# Patient Record
Sex: Female | Born: 1965 | Race: Asian | Hispanic: No | Marital: Married | Smoking: Never smoker
Health system: Southern US, Community
[De-identification: ages and names within clinical notes are randomized; demographics above are authoritative.]

## PROBLEM LIST (undated history)

## (undated) DIAGNOSIS — M1990 Unspecified osteoarthritis, unspecified site: Secondary | ICD-10-CM

---

## 2014-05-13 ENCOUNTER — Encounter (HOSPITAL_COMMUNITY): Payer: Self-pay | Admitting: Emergency Medicine

## 2014-05-13 ENCOUNTER — Emergency Department (HOSPITAL_COMMUNITY): Payer: No Typology Code available for payment source

## 2014-05-13 ENCOUNTER — Emergency Department (HOSPITAL_COMMUNITY)
Admission: EM | Admit: 2014-05-13 | Discharge: 2014-05-13 | Disposition: A | Discharge status: Home / Self Care | Payer: No Typology Code available for payment source | Attending: Emergency Medicine | Admitting: Emergency Medicine

## 2014-05-13 DIAGNOSIS — W19XXXA Unspecified fall, initial encounter: Secondary | ICD-10-CM

## 2014-05-13 DIAGNOSIS — M1990 Unspecified osteoarthritis, unspecified site: Secondary | ICD-10-CM | POA: Diagnosis not present

## 2014-05-13 DIAGNOSIS — M545 Low back pain, unspecified: Secondary | ICD-10-CM

## 2014-05-13 DIAGNOSIS — S79911A Unspecified injury of right hip, initial encounter: Secondary | ICD-10-CM | POA: Diagnosis not present

## 2014-05-13 DIAGNOSIS — Y9289 Other specified places as the place of occurrence of the external cause: Secondary | ICD-10-CM | POA: Diagnosis not present

## 2014-05-13 DIAGNOSIS — W1839XA Other fall on same level, initial encounter: Secondary | ICD-10-CM | POA: Diagnosis not present

## 2014-05-13 DIAGNOSIS — S3992XA Unspecified injury of lower back, initial encounter: Secondary | ICD-10-CM | POA: Diagnosis not present

## 2014-05-13 DIAGNOSIS — Y990 Civilian activity done for income or pay: Secondary | ICD-10-CM | POA: Insufficient documentation

## 2014-05-13 DIAGNOSIS — Y9301 Activity, walking, marching and hiking: Secondary | ICD-10-CM | POA: Insufficient documentation

## 2014-05-13 DIAGNOSIS — M25551 Pain in right hip: Secondary | ICD-10-CM

## 2014-05-13 DIAGNOSIS — Z79899 Other long term (current) drug therapy: Secondary | ICD-10-CM | POA: Diagnosis not present

## 2014-05-13 HISTORY — DX: Unspecified osteoarthritis, unspecified site: M19.90

## 2014-05-13 MED ORDER — HYDROCODONE-ACETAMINOPHEN 5-325 MG PO TABS: 1.0000 | ORAL_TABLET | ORAL | Status: DC | PRN

## 2014-05-13 MED ORDER — HYDROCODONE-ACETAMINOPHEN 5-325 MG PO TABS
1.0000 | ORAL_TABLET | Freq: Once | ORAL | Status: AC
  Administered 2014-05-13: 1 via ORAL
  Filled 2014-05-13: qty 1
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
## 2014-05-13 NOTE — Discharge Instructions (Signed)
Return to the emergency room with worsening of symptoms, new symptoms or with symptoms that are concerning , especially fevers, loss of control of bladder or bowels, numbness or tingling around genital region or anus, weakness. RICE: Rest, Ice (three cycles of 20 mins on, 20mins off at least twice a day), compression/brace, elevation. Heating pad works well for back pain. Ibuprofen 400mg  (2 tablets 200mg ) every 5-6 hours for 3-5 days. Norco for severe pain. Do not operate machinery, drive or drink alcohol while taking narcotics or muscle relaxers. Follow up with PCP if symptoms worsen or are persistent. Read below information and follow recommendations. Hip Pain Your hip is the joint between your upper legs and your lower pelvis. The bones, cartilage, tendons, and muscles of your hip joint perform a lot of work each day supporting your body weight and allowing you to move around. Hip pain can range from a minor ache to severe pain in one or both of your hips. Pain may be felt on the inside of the hip joint near the groin, or the outside near the buttocks and upper thigh. You may have swelling or stiffness as well.  HOME CARE INSTRUCTIONS   Take medicines only as directed by your health care provider.  Apply ice to the injured area:  Put ice in a plastic bag.  Place a towel between your skin and the bag.  Leave the ice on for 15-20 minutes at a time, 3-4 times a day.  Keep your leg raised (elevated) when possible to lessen swelling.  Avoid activities that cause pain.  Follow specific exercises as directed by your health care provider.  Sleep with a pillow between your legs on your most comfortable side.  Record how often you have hip pain, the location of the pain, and what it feels like. SEEK MEDICAL CARE IF:   You are unable to put weight on your leg.  Your hip is red or swollen or very tender to touch.  Your pain or swelling continues or worsens after 1 week.  You have  increasing difficulty walking.  You have a fever. SEEK IMMEDIATE MEDICAL CARE IF:   You have fallen.  You have a sudden increase in pain and swelling in your hip. MAKE SURE YOU:   Understand these instructions.  Will watch your condition.  Will get help right away if you are not doing well or get worse. Document Released: 08/15/2009 Document Revised: 07/12/2013 Document Reviewed: 10/22/2012 [facility] Patient Information 2015 [place], [place]. This information is not intended to replace advice given to you by your health care provider. Make sure you discuss any questions you have with your health care provider.
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
## 2014-05-19 ENCOUNTER — Emergency Department (HOSPITAL_COMMUNITY): Payer: No Typology Code available for payment source

## 2014-05-19 ENCOUNTER — Emergency Department (HOSPITAL_COMMUNITY)
Admission: EM | Admit: 2014-05-19 | Discharge: 2014-05-19 | Disposition: A | Discharge status: Home / Self Care | Payer: No Typology Code available for payment source | Attending: Emergency Medicine | Admitting: Emergency Medicine

## 2014-05-19 ENCOUNTER — Encounter (HOSPITAL_COMMUNITY): Payer: Self-pay | Admitting: *Deleted

## 2014-05-19 DIAGNOSIS — S3991XA Unspecified injury of abdomen, initial encounter: Secondary | ICD-10-CM | POA: Insufficient documentation

## 2014-05-19 DIAGNOSIS — Y9389 Activity, other specified: Secondary | ICD-10-CM | POA: Insufficient documentation

## 2014-05-19 DIAGNOSIS — S3992XA Unspecified injury of lower back, initial encounter: Secondary | ICD-10-CM | POA: Diagnosis not present

## 2014-05-19 DIAGNOSIS — Z79899 Other long term (current) drug therapy: Secondary | ICD-10-CM | POA: Diagnosis not present

## 2014-05-19 DIAGNOSIS — Y990 Civilian activity done for income or pay: Secondary | ICD-10-CM | POA: Diagnosis not present

## 2014-05-19 DIAGNOSIS — M1990 Unspecified osteoarthritis, unspecified site: Secondary | ICD-10-CM | POA: Insufficient documentation

## 2014-05-19 DIAGNOSIS — M545 Low back pain, unspecified: Secondary | ICD-10-CM

## 2014-05-19 DIAGNOSIS — Y92410 Unspecified street and highway as the place of occurrence of the external cause: Secondary | ICD-10-CM | POA: Insufficient documentation

## 2014-05-19 DIAGNOSIS — S29001A Unspecified injury of muscle and tendon of front wall of thorax, initial encounter: Secondary | ICD-10-CM | POA: Diagnosis not present

## 2014-05-19 DIAGNOSIS — R1031 Right lower quadrant pain: Secondary | ICD-10-CM

## 2014-05-19 LAB — COMPREHENSIVE METABOLIC PANEL
ALK PHOS: 86 U/L (ref 39–117)
ALT: 12 U/L (ref 0–35)
ANION GAP: 8 (ref 5–15)
AST: 16 U/L (ref 0–37)
Albumin: 3.7 g/dL (ref 3.5–5.2)
BUN: 29 mg/dL — AB (ref 6–23)
CO2: 24 mmol/L (ref 19–32)
Calcium: 8.9 mg/dL (ref 8.4–10.5)
Chloride: 107 mmol/L (ref 96–112)
Creatinine, Ser: 0.73 mg/dL (ref 0.50–1.10)
Glucose, Bld: 87 mg/dL (ref 70–99)
Potassium: 3.9 mmol/L (ref 3.5–5.1)
Sodium: 139 mmol/L (ref 135–145)
TOTAL PROTEIN: 7.5 g/dL (ref 6.0–8.3)
Total Bilirubin: 0.5 mg/dL (ref 0.3–1.2)

## 2014-05-19 LAB — POC URINE PREG, ED: Preg Test, Ur: NEGATIVE

## 2014-05-19 LAB — CBC WITH DIFFERENTIAL/PLATELET
Basophils Absolute: 0 10*3/uL (ref 0.0–0.1)
Basophils Relative: 0 % (ref 0–1)
EOS ABS: 0.2 10*3/uL (ref 0.0–0.7)
EOS PCT: 2 % (ref 0–5)
HCT: 31.1 % — ABNORMAL LOW (ref 36.0–46.0)
Hemoglobin: 9.7 g/dL — ABNORMAL LOW (ref 12.0–15.0)
Lymphocytes Relative: 26 % (ref 12–46)
Lymphs Abs: 2 10*3/uL (ref 0.7–4.0)
MCH: 23 pg — ABNORMAL LOW (ref 26.0–34.0)
MCHC: 31.2 g/dL (ref 30.0–36.0)
MCV: 73.7 fL — AB (ref 78.0–100.0)
Monocytes Absolute: 0.5 10*3/uL (ref 0.1–1.0)
Monocytes Relative: 7 % (ref 3–12)
NEUTROS PCT: 65 % (ref 43–77)
Neutro Abs: 4.8 10*3/uL (ref 1.7–7.7)
Platelets: 337 10*3/uL (ref 150–400)
RBC: 4.22 MIL/uL (ref 3.87–5.11)
RDW: 14.7 % (ref 11.5–15.5)
WBC: 7.5 10*3/uL (ref 4.0–10.5)

## 2014-05-19 LAB — TROPONIN I: Troponin I: <0.03 ng/mL (ref <0.031)

## 2014-05-19 MED ORDER — HYDROCODONE-ACETAMINOPHEN 5-325 MG PO TABS
1.0000 | ORAL_TABLET | Freq: Once | ORAL | Status: AC
  Administered 2014-05-19: 1 via ORAL
  Filled 2014-05-19: qty 1

## 2014-05-19 MED ORDER — ONDANSETRON HCL 4 MG/2ML IJ SOLN
4.0000 mg | Freq: Once | INTRAMUSCULAR | Status: AC
  Administered 2014-05-19: 4 mg via INTRAVENOUS
  Filled 2014-05-19: qty 2

## 2014-05-19 MED ORDER — MORPHINE SULFATE 4 MG/ML IJ SOLN
4.0000 mg | Freq: Once | INTRAMUSCULAR | Status: AC
  Administered 2014-05-19: 4 mg via INTRAVENOUS
  Filled 2014-05-19: qty 1

## 2014-05-19 MED ORDER — IOHEXOL 300 MG/ML  SOLN
100.0000 mL | Freq: Once | INTRAMUSCULAR | Status: AC | PRN
  Administered 2014-05-19: 80 mL via INTRAVENOUS

## 2014-05-19 MED ORDER — SODIUM CHLORIDE 0.9 % IV BOLUS (SEPSIS)
1000.0000 mL | Freq: Once | INTRAVENOUS | Status: AC
  Administered 2014-05-19: 1000 mL via INTRAVENOUS

## 2014-05-19 MED ORDER — HYDROCODONE-ACETAMINOPHEN 5-325 MG PO TABS: 1.0000 | ORAL_TABLET | ORAL | Status: AC | PRN

## 2014-05-19 NOTE — Discharge Instructions (Signed)
You are anemic on your labs today (low hemoglobin), this needs to be rechecked by your family doctor.  Your liver was enlarged today, this needs to be rechecked by your family doctor.   Back Pain, Adult Low back pain is very common. About 1 in 5 people have back pain.The cause of low back pain is rarely dangerous. The pain often gets better over time.About half of people with a sudden onset of back pain feel better in just 2 weeks. About 8 in 10 people feel better by 6 weeks.  CAUSES Some common causes of back pain include:  Strain of the muscles or ligaments supporting the spine.  Wear and tear (degeneration) of the spinal discs.  Arthritis.  Direct injury to the back. DIAGNOSIS Most of the time, the direct cause of low back pain is not known.However, back pain can be treated effectively even when the exact cause of the pain is unknown.Answering your caregiver's questions about your overall health and symptoms is one of the most accurate ways to make sure the cause of your pain is not dangerous. If your caregiver needs more information, he or she may order lab work or imaging tests (X-rays or MRIs).However, even if imaging tests show changes in your back, this usually does not require surgery. HOME CARE INSTRUCTIONS For many people, back pain returns.Since low back pain is rarely dangerous, it is often a condition that people can learn to [facility] their own.   Remain active. It is stressful on the back to sit or stand in one place. Do not sit, drive, or stand in one place for more than 30 minutes at a time. Take short walks on level surfaces as soon as pain allows.Try to increase the length of time you walk each day.  Do not stay in bed.Resting more than 1 or 2 days can delay your recovery.  Do not avoid exercise or work.Your body is made to move.It is not dangerous to be active, even though your back may hurt.Your back will likely heal faster if you return to being active before  your pain is gone.  Pay attention to your body when you bend and lift. Many people have less discomfortwhen lifting if they bend their knees, keep the load close to their bodies,and avoid twisting. Often, the most comfortable positions are those that put less stress on your recovering back.  Find a comfortable position to sleep. Use a firm mattress and lie on your side with your knees slightly bent. If you lie on your back, put a pillow under your knees.  Only take over-the-counter or prescription medicines as directed by your caregiver. Over-the-counter medicines to reduce pain and inflammation are often the most helpful.Your caregiver may prescribe muscle relaxant drugs.These medicines help dull your pain so you can more quickly return to your normal activities and healthy exercise.  Put ice on the injured area.  Put ice in a plastic bag.  Place a towel between your skin and the bag.  Leave the ice on for 15-20 minutes, 03-04 times a day for the first 2 to 3 days. After that, ice and heat may be alternated to reduce pain and spasms.  Ask your caregiver about trying back exercises and gentle massage. This may be of some benefit.  Avoid feeling anxious or stressed.Stress increases muscle tension and can worsen back pain.It is important to recognize when you are anxious or stressed and learn ways to manage it.Exercise is a great option. SEEK MEDICAL CARE IF:  You have pain that is not relieved with rest or medicine.  You have pain that does not improve in 1 week.  You have new symptoms.  You are generally not feeling well. SEEK IMMEDIATE MEDICAL CARE IF:   You have pain that radiates from your back into your legs.  You develop new bowel or bladder control problems.  You have unusual weakness or numbness in your arms or legs.  You develop nausea or vomiting.  You develop abdominal pain.  You feel faint. Document Released: 02/25/2005 Document Revised: 08/27/2011 Document  Reviewed: 06/29/2013 [facility] Patient Information 2015 [place], [place]. This information is not intended to replace advice given to you by your health care provider. Make sure you discuss any questions you have with your health care provider. Hepatomegaly Hepatomegaly means the liver is larger than normal (enlarged). Some health problems can cause the liver to get bigger. Some people have an enlarged liver but do not know it.  CAUSES Possible causes of hepatomegaly include:  Liver disease, such as:  Cirrhosis. This is long-term (chronic) liver damage often caused by drinking too much alcohol. Cirrhosis may also be caused by other liver problems.  Hepatitis. This is an infection of the liver.  Fatty liver disease.  Disorders that cause things to accumulate in the liver (Wilson's disease, amyloidosis, hemochromatosis).  Cancer. The disease may start in the liver, or cancer may start somewhere else in the body and spread to the liver.  Heart or blood vessel disease. These can cause hepatomegaly if blood backs up into the liver. SYMPTOMS  Some people have no symptoms. If symptoms are present, they may include:  Abdominal pain on the right side.  Fatigue.  Loss of appetite.  Nausea.  Vomiting.  Yellowing of the skin and whites of the eyes (jaundice). DIAGNOSIS  To decide if your liver is enlarged, a caregiver will ask about your history and perform a physical exam. The caregiver may press on the right side of your abdomen to feel your liver. This is a way to check if the edge of your liver sticks out below your rib cage. Your caregiver may also order some tests that include:  Blood tests. These tests check whether your liver is working like it should be. They also check for infection.  Imaging tests. These are tests that take pictures of your liver. They may include:  A computed tomography (CT) scan. This is an X-ray guided by a computer.  Magnetic resonance imaging (MRI). This  test creates pictures by using magnets and a computer.  An ultrasound. Images are made using sound waves.  Liver biopsy. A small sample of liver tissue is taken out and examined under a microscope. TREATMENT  Treatment of hepatomegaly depends on what is causing it. HOME CARE INSTRUCTIONS What you need to do at home depends on the cause of your hepatomegaly. In general:  Take all medicine as directed by your caregiver. Follow the directions carefully. Do not start taking any new medicine unless your caregiver says it is okay. This includes over-the-counter medicines, supplements, and herbal remedies. Some of these can hurt your liver.  Stay at a healthy weight.  Follow a healthy diet. Eat lots of fruits, vegetables, and whole grains.  Do not drink alcohol.  Do not smoke.  Keep all follow-up appointments to make sure your treatment is working and your liver stays healthy. SEEK MEDICAL CARE IF:  You have increased or localized abdominal pain.  You have persistent vomiting. SEEK IMMEDIATE MEDICAL  CARE IF:   You vomit bright red blood or blood that looks like coffee grounds.  You have chest pain.  You have trouble breathing. MAKE SURE YOU:  Understand these instructions.  Will watch your condition.  Will get help right away if you are not doing well or get worse. Document Released: 05/20/2011 Document Reviewed: 05/20/2011 [facility] Patient Information 2015 [place], [place]. This information is not intended to replace advice given to you by your health care provider. Make sure you discuss any questions you have with your health care provider. Anemia, Nonspecific Anemia is a condition in which the concentration of red blood cells or hemoglobin in the blood is below normal. Hemoglobin is a substance in red blood cells that carries oxygen to the tissues of the body. Anemia results in not enough oxygen reaching these tissues.  CAUSES  Common causes of anemia include:   Excessive  bleeding. Bleeding may be internal or external. This includes excessive bleeding from periods (in women) or from the intestine.   Poor nutrition.   Chronic kidney, thyroid, and liver disease.  Bone marrow disorders that decrease red blood cell production.  Cancer and treatments for cancer.  HIV, AIDS, and their treatments.  Spleen problems that increase red blood cell destruction.  Blood disorders.  Excess destruction of red blood cells due to infection, medicines, and autoimmune disorders. SIGNS AND SYMPTOMS   Minor weakness.   Dizziness.   Headache.  Palpitations.   Shortness of breath, especially with exercise.   Paleness.  Cold sensitivity.  Indigestion.  Nausea.  Difficulty sleeping.  Difficulty concentrating. Symptoms may occur suddenly or they may develop slowly.  DIAGNOSIS  Additional blood tests are often needed. These help your health care provider determine the best treatment. Your health care provider will check your stool for blood and look for other causes of blood loss.  TREATMENT  Treatment varies depending on the cause of the anemia. Treatment can include:   Supplements of iron, vitamin B12, or folic acid.   Hormone medicines.   A blood transfusion. This may be needed if blood loss is severe.   Hospitalization. This may be needed if there is significant continual blood loss.   Dietary changes.  Spleen removal. HOME CARE INSTRUCTIONS Keep all follow-up appointments. It often takes many weeks to correct anemia, and having your health care provider check on your condition and your response to treatment is very important. SEEK IMMEDIATE MEDICAL CARE IF:   You develop extreme weakness, shortness of breath, or chest pain.   You become dizzy or have trouble concentrating.  You develop heavy vaginal bleeding.   You develop a rash.   You have bloody or black, tarry stools.   You faint.   You vomit up blood.   You  vomit repeatedly.   You have abdominal pain.  You have a fever or persistent symptoms for more than 2-3 days.   You have a fever and your symptoms suddenly get worse.   You are dehydrated.  MAKE SURE YOU:  Understand these instructions.  Will watch your condition.  Will get help right away if you are not doing well or get worse. Document Released: 04/04/2004 Document Revised: 10/28/2012 Document Reviewed: 08/21/2012 [facility] Patient Information 2015 [place], [place]. This information is not intended to replace advice given to you by your health care provider. Make sure you discuss any questions you have with your health care provider.
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
## 2014-05-30 ENCOUNTER — Ambulatory Visit: Payer: No Typology Code available for payment source | Attending: Internal Medicine | Admitting: Internal Medicine

## 2014-05-30 ENCOUNTER — Encounter: Payer: Self-pay | Admitting: Internal Medicine

## 2014-05-30 VITALS — BP 113/70 | HR 89 | Temp 98.3°F | Resp 16 | Wt 122.0 lb

## 2014-05-30 DIAGNOSIS — Z1231 Encounter for screening mammogram for malignant neoplasm of breast: Secondary | ICD-10-CM

## 2014-05-30 DIAGNOSIS — Z139 Encounter for screening, unspecified: Secondary | ICD-10-CM | POA: Insufficient documentation

## 2014-05-30 DIAGNOSIS — D649 Anemia, unspecified: Secondary | ICD-10-CM | POA: Insufficient documentation

## 2014-05-30 DIAGNOSIS — M62830 Muscle spasm of back: Secondary | ICD-10-CM | POA: Diagnosis not present

## 2014-05-30 MED ORDER — CYCLOBENZAPRINE HCL 10 MG PO TABS: 10.0000 mg | ORAL_TABLET | Freq: Every day | ORAL | Status: AC
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
## 2014-05-31 ENCOUNTER — Telehealth: Payer: Self-pay

## 2014-05-31 LAB — VITAMIN D 25 HYDROXY (VIT D DEFICIENCY, FRACTURES): Vit D, 25-Hydroxy: 25 ng/mL — ABNORMAL LOW (ref 30–100)

## 2014-05-31 LAB — CBC WITH DIFFERENTIAL/PLATELET
BASOS ABS: 0.1 10*3/uL (ref 0.0–0.1)
BASOS PCT: 1 % (ref 0–1)
EOS PCT: 2 % (ref 0–5)
Eosinophils Absolute: 0.2 10*3/uL (ref 0.0–0.7)
HCT: 33.2 % — ABNORMAL LOW (ref 36.0–46.0)
Hemoglobin: 10.3 g/dL — ABNORMAL LOW (ref 12.0–15.0)
Lymphocytes Relative: 25 % (ref 12–46)
Lymphs Abs: 2 10*3/uL (ref 0.7–4.0)
MCH: 22.5 pg — AB (ref 26.0–34.0)
MCHC: 31 g/dL (ref 30.0–36.0)
MCV: 72.6 fL — ABNORMAL LOW (ref 78.0–100.0)
MONO ABS: 0.6 10*3/uL (ref 0.1–1.0)
MPV: 8.4 fL — ABNORMAL LOW (ref 8.6–12.4)
Monocytes Relative: 7 % (ref 3–12)
Neutro Abs: 5.2 10*3/uL (ref 1.7–7.7)
Neutrophils Relative %: 65 % (ref 43–77)
Platelets: 438 10*3/uL — ABNORMAL HIGH (ref 150–400)
RBC: 4.57 MIL/uL (ref 3.87–5.11)
RDW: 15.1 % (ref 11.5–15.5)
WBC: 8 10*3/uL (ref 4.0–10.5)

## 2014-05-31 LAB — LIPID PANEL
CHOLESTEROL: 137 mg/dL (ref 0–200)
HDL: 38 mg/dL — ABNORMAL LOW (ref >=46)
LDL Cholesterol: 79 mg/dL (ref 0–99)
TRIGLYCERIDES: 100 mg/dL (ref <150)
Total CHOL/HDL Ratio: 3.6 Ratio
VLDL: 20 mg/dL (ref 0–40)

## 2014-05-31 LAB — ANEMIA PANEL
%SAT: 5 % — AB (ref 20–55)
ABS RETIC: 22.9 10*3/uL (ref 19.0–186.0)
Ferritin: 8 ng/mL — ABNORMAL LOW (ref 10–291)
Folate: 13.7 ng/mL
IRON: 19 ug/dL — AB (ref 42–145)
RBC.: 4.57 MIL/uL (ref 3.87–5.11)
Retic Ct Pct: 0.5 % (ref 0.4–2.3)
TIBC: 360 ug/dL (ref 250–470)
UIBC: 341 ug/dL (ref 125–400)
Vitamin B-12: 1047 pg/mL — ABNORMAL HIGH (ref 211–911)

## 2014-05-31 LAB — HEMOGLOBIN A1C
HEMOGLOBIN A1C: 5.7 % — AB (ref <5.7)
Mean Plasma Glucose: 117 mg/dL — ABNORMAL HIGH (ref <117)

## 2014-05-31 MED ORDER — VITAMIN D (ERGOCALCIFEROL) 1.25 MG (50000 UNIT) PO CAPS: 50000.0000 [IU] | ORAL_CAPSULE | ORAL | Status: AC

## 2014-05-31 NOTE — Telephone Encounter (Signed)
Patient is aware of her lab results Prescription sent to [facility] for vitamin D

## 2014-05-31 NOTE — Telephone Encounter (Signed)
-----   Message from Doris Cheadleeepak Advani, MD sent at 05/31/2014  9:21 AM EDT ----- Blood work reviewed, call and let the patient know that she has iron deficiency anemia, advise patient to start taking over-the-counter iron supplement 3 times a day, will repeat CBC on the following visit. noticed low vitamin D, call patient advise to start ergocalciferol 50,000 units once a week for the duration of  12 weeks.  noticed impaired fasting glucose, with hemoglobin A1c of 5.7%, call and advise patient for low carbohydrate diet.

## 2014-06-07 ENCOUNTER — Encounter: Payer: Self-pay | Admitting: Family Medicine

## 2014-06-07 ENCOUNTER — Ambulatory Visit (HOSPITAL_COMMUNITY)
Admission: RE | Admit: 2014-06-07 | Discharge: 2014-06-07 | Disposition: A | Discharge status: Home / Self Care | Payer: No Typology Code available for payment source | Source: Ambulatory Visit | Attending: Chiropractic Medicine | Admitting: Chiropractic Medicine

## 2014-06-07 ENCOUNTER — Ambulatory Visit: Payer: Self-pay | Attending: Family Medicine | Admitting: Family Medicine

## 2014-06-07 ENCOUNTER — Other Ambulatory Visit (HOSPITAL_COMMUNITY): Payer: Self-pay | Admitting: Chiropractic Medicine

## 2014-06-07 VITALS — BP 95/65 | HR 74 | Temp 98.5°F | Resp 18 | Ht 62.0 in | Wt 122.0 lb

## 2014-06-07 DIAGNOSIS — Z124 Encounter for screening for malignant neoplasm of cervix: Secondary | ICD-10-CM | POA: Insufficient documentation

## 2014-06-07 DIAGNOSIS — B9689 Other specified bacterial agents as the cause of diseases classified elsewhere: Secondary | ICD-10-CM

## 2014-06-07 DIAGNOSIS — X58XXXA Exposure to other specified factors, initial encounter: Secondary | ICD-10-CM | POA: Insufficient documentation

## 2014-06-07 DIAGNOSIS — N760 Acute vaginitis: Secondary | ICD-10-CM

## 2014-06-07 DIAGNOSIS — S4991XA Unspecified injury of right shoulder and upper arm, initial encounter: Secondary | ICD-10-CM

## 2014-06-07 DIAGNOSIS — S59901A Unspecified injury of right elbow, initial encounter: Secondary | ICD-10-CM | POA: Diagnosis not present

## 2014-06-07 DIAGNOSIS — A499 Bacterial infection, unspecified: Secondary | ICD-10-CM

## 2014-06-07 LAB — CERVICOVAGINAL ANCILLARY ONLY: WET PREP (BD AFFIRM): POSITIVE — AB

## 2014-06-07 NOTE — Patient Instructions (Signed)
Ms. Graciela HusbandsChauhan,  Thank you for coming in today. Very nice to meet you.  Pap done today. You will be called with results.  F/u with Dr. Orpah CobbAdvani in 3 months for routine care  Dr. Armen PickupFunches

## 2014-06-07 NOTE — Progress Notes (Signed)
   Subjective:    Patient ID: Marissa Mason, female    DOB: March 28, 1965, [age] y.o.   MRN: [id number] CC: here for pap  PCP: Dr. Orpah CobbAdvani  HPI  1. Pap: patient here for screening pap. No complaints. LMP, 3 years ago.   2. Breast exam: no breast pain or masses. Due for screening mammogram. Has an appt in 2 days.   Soc Hx: non smoker  Review of Systems  Genitourinary: Negative for vaginal bleeding and vaginal discharge.  breast: negative for lumps or pain      Objective:   Physical Exam BP 95/65 mmHg  Pulse 74  Temp(Src) 98.5 F (36.9 C) (Oral)  Resp 18  Ht 5\' 2"  (1.575 m)  Wt 122 lb (55.339 kg)  BMI 22.31 kg/m2  SpO2 100% General appearance: alert, cooperative and no distress Breasts: normal appearance, no masses or tenderness, No nipple retraction or dimpling, No nipple discharge or bleeding, No axillary or supraclavicular adenopathy, Normal to palpation without dominant masses Pelvic: cervix normal in appearance, external genitalia normal, no adnexal masses or tenderness, no cervical motion tenderness, rectovaginal septum normal, uterus normal size, shape, and consistency and vagina normal without discharge       Assessment & Plan:

## 2014-06-07 NOTE — Assessment & Plan Note (Signed)
Pap done today  

## 2014-06-08 LAB — CERVICOVAGINAL ANCILLARY ONLY
Chlamydia: NEGATIVE
Neisseria Gonorrhea: NEGATIVE

## 2014-06-08 LAB — CYTOLOGY - PAP

## 2014-06-09 ENCOUNTER — Ambulatory Visit (HOSPITAL_COMMUNITY): Payer: No Typology Code available for payment source | Attending: Internal Medicine

## 2014-06-09 DIAGNOSIS — N760 Acute vaginitis: Secondary | ICD-10-CM

## 2014-06-09 DIAGNOSIS — B9689 Other specified bacterial agents as the cause of diseases classified elsewhere: Secondary | ICD-10-CM | POA: Insufficient documentation

## 2014-06-09 MED ORDER — FLUCONAZOLE 150 MG PO TABS: 150.0000 mg | ORAL_TABLET | Freq: Once | ORAL | Status: AC

## 2014-06-09 MED ORDER — METRONIDAZOLE 500 MG PO TABS: 500.0000 mg | ORAL_TABLET | Freq: Two times a day (BID) | ORAL | Status: AC

## 2014-06-09 NOTE — Addendum Note (Signed)
Addended by: Dessa PhiFUNCHES, Saara Kijowski on: 06/09/2014 04:14 PM   Modules accepted: Orders

## 2014-06-09 NOTE — Assessment & Plan Note (Signed)
A; BV on screening wet prep P: flagyl followed by diflucan

## 2014-06-17 ENCOUNTER — Other Ambulatory Visit: Payer: Self-pay | Admitting: Physician Assistant
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
